# Patient Record
Sex: Male | Born: 1935 | Race: White | Hispanic: No | Marital: Married
Health system: Southern US, Community
[De-identification: ages and names within clinical notes are randomized; demographics above are authoritative.]

---

## 2014-04-27 ENCOUNTER — Other Ambulatory Visit (HOSPITAL_COMMUNITY): Payer: Medicare Other

## 2014-04-27 ENCOUNTER — Ambulatory Visit (HOSPITAL_COMMUNITY)
Admission: AD | Admit: 2014-04-27 | Discharge: 2014-04-27 | Disposition: A | Discharge status: Home / Self Care | Payer: Medicare Other | Source: Other Acute Inpatient Hospital | Attending: Internal Medicine | Admitting: Internal Medicine

## 2014-04-27 ENCOUNTER — Inpatient Hospital Stay
Admission: EM | Admit: 2014-04-27 | Discharge: 2014-06-14 | Disposition: E | Payer: Medicare Other | Source: Other Acute Inpatient Hospital | Attending: Internal Medicine | Admitting: Internal Medicine

## 2014-04-27 DIAGNOSIS — T17908A Unspecified foreign body in respiratory tract, part unspecified causing other injury, initial encounter: Secondary | ICD-10-CM

## 2014-04-27 DIAGNOSIS — Z452 Encounter for adjustment and management of vascular access device: Secondary | ICD-10-CM

## 2014-04-27 DIAGNOSIS — Z4659 Encounter for fitting and adjustment of other gastrointestinal appliance and device: Secondary | ICD-10-CM

## 2014-04-27 DIAGNOSIS — J9690 Respiratory failure, unspecified, unspecified whether with hypoxia or hypercapnia: Secondary | ICD-10-CM | POA: Diagnosis present

## 2014-04-27 DIAGNOSIS — I82C19 Acute embolism and thrombosis of unspecified internal jugular vein: Secondary | ICD-10-CM

## 2014-04-27 DIAGNOSIS — J189 Pneumonia, unspecified organism: Secondary | ICD-10-CM

## 2014-04-27 LAB — CBC WITH DIFFERENTIAL/PLATELET
BASOS ABS: 0 10*3/uL (ref 0.0–0.1)
BASOS PCT: 0 % (ref 0–1)
Eosinophils Absolute: 0.1 10*3/uL (ref 0.0–0.7)
Eosinophils Relative: 1 % (ref 0–5)
HCT: 18.9 % — ABNORMAL LOW (ref 39.0–52.0)
Hemoglobin: 6.2 g/dL — CL (ref 13.0–17.0)
LYMPHS PCT: 15 % (ref 12–46)
Lymphs Abs: 1.7 10*3/uL (ref 0.7–4.0)
MCH: 29.8 pg (ref 26.0–34.0)
MCHC: 32.8 g/dL (ref 30.0–36.0)
MCV: 90.9 fL (ref 78.0–100.0)
MONOS PCT: 13 % — AB (ref 3–12)
Monocytes Absolute: 1.4 10*3/uL — ABNORMAL HIGH (ref 0.1–1.0)
NEUTROS ABS: 7.8 10*3/uL — AB (ref 1.7–7.7)
NEUTROS PCT: 71 % (ref 43–77)
Platelets: 320 10*3/uL (ref 150–400)
RBC: 2.08 MIL/uL — ABNORMAL LOW (ref 4.22–5.81)
RDW: 15.7 % — ABNORMAL HIGH (ref 11.5–15.5)
WBC: 11 10*3/uL — ABNORMAL HIGH (ref 4.0–10.5)

## 2014-04-27 LAB — BLOOD GAS, ARTERIAL
Acid-base deficit: 13 mmol/L — ABNORMAL HIGH (ref 0.0–2.0)
Bicarbonate: 11.7 mEq/L — ABNORMAL LOW (ref 20.0–24.0)
DELIVERY SYSTEMS: POSITIVE
EXPIRATORY PAP: 6
FIO2: 0.4 %
Inspiratory PAP: 12
O2 Saturation: 99 %
PATIENT TEMPERATURE: 97.1
PO2 ART: 153 mmHg — AB (ref 80.0–100.0)
RATE: 12 resp/min
TCO2: 12.3 mmol/L (ref 0–100)
pCO2 arterial: 21.1 mmHg — ABNORMAL LOW (ref 35.0–45.0)
pH, Arterial: 7.357 (ref 7.350–7.450)

## 2014-04-27 LAB — COMPREHENSIVE METABOLIC PANEL
ALK PHOS: 44 U/L (ref 39–117)
ALT: 16 U/L (ref 0–53)
AST: 23 U/L (ref 0–37)
Albumin: 1.7 g/dL — ABNORMAL LOW (ref 3.5–5.2)
Anion gap: 21 — ABNORMAL HIGH (ref 5–15)
BILIRUBIN TOTAL: 0.7 mg/dL (ref 0.3–1.2)
BUN: 66 mg/dL — ABNORMAL HIGH (ref 6–23)
CHLORIDE: 105 meq/L (ref 96–112)
CO2: 12 mEq/L — ABNORMAL LOW (ref 19–32)
Calcium: 7.8 mg/dL — ABNORMAL LOW (ref 8.4–10.5)
Creatinine, Ser: 2.24 mg/dL — ABNORMAL HIGH (ref 0.50–1.35)
GFR calc Af Amer: 28 mL/min — ABNORMAL LOW (ref >90)
GFR calc non Af Amer: 24 mL/min — ABNORMAL LOW (ref >90)
Glucose, Bld: 104 mg/dL — ABNORMAL HIGH (ref 70–99)
POTASSIUM: 4.2 meq/L (ref 3.7–5.3)
Sodium: 138 mEq/L (ref 137–147)
Total Protein: 4.2 g/dL — ABNORMAL LOW (ref 6.0–8.3)

## 2014-04-27 LAB — APTT: aPTT: 22 seconds — ABNORMAL LOW (ref 24–37)

## 2014-04-27 LAB — PHOSPHORUS: Phosphorus: 6.6 mg/dL — ABNORMAL HIGH (ref 2.3–4.6)

## 2014-04-27 LAB — TROPONIN I: TROPONIN I: 0.54 ng/mL — AB (ref <0.30)

## 2014-04-27 LAB — PROTIME-INR
INR: 1.63 — ABNORMAL HIGH (ref 0.00–1.49)
Prothrombin Time: 19.5 seconds — ABNORMAL HIGH (ref 11.6–15.2)

## 2014-04-27 LAB — CK TOTAL AND CKMB (NOT AT ARMC)
CK TOTAL: 112 U/L (ref 7–232)
CK, MB: 5.6 ng/mL — ABNORMAL HIGH (ref 0.3–4.0)
Relative Index: 5 — ABNORMAL HIGH (ref 0.0–2.5)

## 2014-04-27 LAB — ABO/RH: ABO/RH(D): B NEG

## 2014-04-27 LAB — MAGNESIUM: Magnesium: 2.5 mg/dL (ref 1.5–2.5)

## 2014-04-27 LAB — PREPARE RBC (CROSSMATCH)

## 2014-04-27 LAB — VANCOMYCIN, RANDOM: VANCOMYCIN RM: 27.9 ug/mL

## 2014-04-27 LAB — TSH: TSH: 5.41 u[IU]/mL — ABNORMAL HIGH (ref 0.350–4.500)

## 2014-04-28 LAB — BASIC METABOLIC PANEL
Anion gap: 16 — ABNORMAL HIGH (ref 5–15)
BUN: 80 mg/dL — AB (ref 6–23)
CO2: 17 mEq/L — ABNORMAL LOW (ref 19–32)
Calcium: 8 mg/dL — ABNORMAL LOW (ref 8.4–10.5)
Chloride: 107 mEq/L (ref 96–112)
Creatinine, Ser: 2.6 mg/dL — ABNORMAL HIGH (ref 0.50–1.35)
GFR calc Af Amer: 24 mL/min — ABNORMAL LOW (ref >90)
GFR, EST NON AFRICAN AMERICAN: 20 mL/min — AB (ref >90)
Glucose, Bld: 105 mg/dL — ABNORMAL HIGH (ref 70–99)
Potassium: 4.2 mEq/L (ref 3.7–5.3)
SODIUM: 140 meq/L (ref 137–147)

## 2014-04-28 LAB — T4, FREE: FREE T4: 0.94 ng/dL (ref 0.80–1.80)

## 2014-04-28 LAB — CBC
HCT: 26.3 % — ABNORMAL LOW (ref 39.0–52.0)
Hemoglobin: 9.1 g/dL — ABNORMAL LOW (ref 13.0–17.0)
MCH: 30.1 pg (ref 26.0–34.0)
MCHC: 34.6 g/dL (ref 30.0–36.0)
MCV: 87.1 fL (ref 78.0–100.0)
PLATELETS: 227 10*3/uL (ref 150–400)
RBC: 3.02 MIL/uL — ABNORMAL LOW (ref 4.22–5.81)
RDW: 15.5 % (ref 11.5–15.5)
WBC: 14.1 10*3/uL — ABNORMAL HIGH (ref 4.0–10.5)

## 2014-04-28 LAB — VANCOMYCIN, RANDOM: Vancomycin Rm: 20.1 ug/mL

## 2014-04-28 LAB — HEMOGLOBIN AND HEMATOCRIT, BLOOD
HCT: 25.8 % — ABNORMAL LOW (ref 39.0–52.0)
HCT: 26 % — ABNORMAL LOW (ref 39.0–52.0)
HEMATOCRIT: 24.6 % — AB (ref 39.0–52.0)
HEMOGLOBIN: 8.8 g/dL — AB (ref 13.0–17.0)
Hemoglobin: 9 g/dL — ABNORMAL LOW (ref 13.0–17.0)
Hemoglobin: 8.5 g/dL — ABNORMAL LOW (ref 13.0–17.0)

## 2014-04-28 LAB — VITAMIN B12: VITAMIN B 12: 352 pg/mL (ref 211–911)

## 2014-04-29 ENCOUNTER — Institutional Professional Consult (permissible substitution) (HOSPITAL_COMMUNITY): Payer: Medicare Other

## 2014-04-29 LAB — BASIC METABOLIC PANEL
Anion gap: 20 — ABNORMAL HIGH (ref 5–15)
BUN: 119 mg/dL — ABNORMAL HIGH (ref 6–23)
CALCIUM: 8.1 mg/dL — AB (ref 8.4–10.5)
CO2: 14 meq/L — AB (ref 19–32)
Chloride: 116 mEq/L — ABNORMAL HIGH (ref 96–112)
Creatinine, Ser: 3.12 mg/dL — ABNORMAL HIGH (ref 0.50–1.35)
GFR calc Af Amer: 19 mL/min — ABNORMAL LOW (ref >90)
GFR calc non Af Amer: 16 mL/min — ABNORMAL LOW (ref >90)
Glucose, Bld: 93 mg/dL (ref 70–99)
POTASSIUM: 4 meq/L (ref 3.7–5.3)
SODIUM: 150 meq/L — AB (ref 137–147)

## 2014-04-29 LAB — CBC
HCT: 24.3 % — ABNORMAL LOW (ref 39.0–52.0)
Hemoglobin: 8.2 g/dL — ABNORMAL LOW (ref 13.0–17.0)
MCH: 29.4 pg (ref 26.0–34.0)
MCHC: 33.7 g/dL (ref 30.0–36.0)
MCV: 87.1 fL (ref 78.0–100.0)
Platelets: 235 10*3/uL (ref 150–400)
RBC: 2.79 MIL/uL — ABNORMAL LOW (ref 4.22–5.81)
RDW: 17 % — ABNORMAL HIGH (ref 11.5–15.5)
WBC: 11.4 10*3/uL — ABNORMAL HIGH (ref 4.0–10.5)

## 2014-04-29 LAB — FOLATE RBC: RBC FOLATE: 1819 ng/mL — AB (ref >280)

## 2014-04-30 ENCOUNTER — Other Ambulatory Visit (HOSPITAL_COMMUNITY): Payer: Medicare Other

## 2014-04-30 LAB — COMPREHENSIVE METABOLIC PANEL
ALT: 48 U/L (ref 0–53)
ANION GAP: 20 — AB (ref 5–15)
AST: 52 U/L — ABNORMAL HIGH (ref 0–37)
Albumin: 1.8 g/dL — ABNORMAL LOW (ref 3.5–5.2)
Alkaline Phosphatase: 42 U/L (ref 39–117)
BILIRUBIN TOTAL: 0.4 mg/dL (ref 0.3–1.2)
BUN: 123 mg/dL — AB (ref 6–23)
CHLORIDE: 115 meq/L — AB (ref 96–112)
CO2: 14 meq/L — AB (ref 19–32)
CREATININE: 3.48 mg/dL — AB (ref 0.50–1.35)
Calcium: 8 mg/dL — ABNORMAL LOW (ref 8.4–10.5)
GFR calc Af Amer: 17 mL/min — ABNORMAL LOW (ref >90)
GFR, EST NON AFRICAN AMERICAN: 14 mL/min — AB (ref >90)
Glucose, Bld: 194 mg/dL — ABNORMAL HIGH (ref 70–99)
Potassium: 4.4 mEq/L (ref 3.7–5.3)
Sodium: 149 mEq/L — ABNORMAL HIGH (ref 137–147)
Total Protein: 4.1 g/dL — ABNORMAL LOW (ref 6.0–8.3)

## 2014-04-30 LAB — CBC
HEMATOCRIT: 21.4 % — AB (ref 39.0–52.0)
Hemoglobin: 6.9 g/dL — CL (ref 13.0–17.0)
MCH: 29.5 pg (ref 26.0–34.0)
MCHC: 32.2 g/dL (ref 30.0–36.0)
MCV: 91.5 fL (ref 78.0–100.0)
Platelets: 244 10*3/uL (ref 150–400)
RBC: 2.34 MIL/uL — AB (ref 4.22–5.81)
RDW: 18 % — ABNORMAL HIGH (ref 11.5–15.5)
WBC: 13.9 10*3/uL — AB (ref 4.0–10.5)

## 2014-04-30 LAB — MAGNESIUM: MAGNESIUM: 2.7 mg/dL — AB (ref 1.5–2.5)

## 2014-04-30 LAB — PHOSPHORUS: Phosphorus: 6.1 mg/dL — ABNORMAL HIGH (ref 2.3–4.6)

## 2014-05-01 LAB — CBC
HEMATOCRIT: 28.1 % — AB (ref 39.0–52.0)
Hemoglobin: 9 g/dL — ABNORMAL LOW (ref 13.0–17.0)
MCH: 28.6 pg (ref 26.0–34.0)
MCHC: 32 g/dL (ref 30.0–36.0)
MCV: 89.2 fL (ref 78.0–100.0)
Platelets: 221 10*3/uL (ref 150–400)
RBC: 3.15 MIL/uL — ABNORMAL LOW (ref 4.22–5.81)
RDW: 17.6 % — AB (ref 11.5–15.5)
WBC: 16.4 10*3/uL — ABNORMAL HIGH (ref 4.0–10.5)

## 2014-05-01 LAB — TYPE AND SCREEN
ABO/RH(D): B NEG
ANTIBODY SCREEN: NEGATIVE
UNIT DIVISION: 0
UNIT DIVISION: 0
UNIT DIVISION: 0
Unit division: 0
Unit division: 0
Unit division: 0

## 2014-05-01 LAB — RENAL FUNCTION PANEL
ALBUMIN: 1.9 g/dL — AB (ref 3.5–5.2)
ANION GAP: 17 — AB (ref 5–15)
BUN: 121 mg/dL — AB (ref 6–23)
CHLORIDE: 115 meq/L — AB (ref 96–112)
CO2: 16 mEq/L — ABNORMAL LOW (ref 19–32)
CREATININE: 3.91 mg/dL — AB (ref 0.50–1.35)
Calcium: 8.1 mg/dL — ABNORMAL LOW (ref 8.4–10.5)
GFR calc Af Amer: 14 mL/min — ABNORMAL LOW (ref >90)
GFR, EST NON AFRICAN AMERICAN: 12 mL/min — AB (ref >90)
Glucose, Bld: 169 mg/dL — ABNORMAL HIGH (ref 70–99)
PHOSPHORUS: 6.3 mg/dL — AB (ref 2.3–4.6)
POTASSIUM: 4.7 meq/L (ref 3.7–5.3)
Sodium: 148 mEq/L — ABNORMAL HIGH (ref 137–147)

## 2014-05-01 LAB — MAGNESIUM: Magnesium: 2.9 mg/dL — ABNORMAL HIGH (ref 1.5–2.5)

## 2014-05-02 LAB — CBC
HEMATOCRIT: 27.4 % — AB (ref 39.0–52.0)
Hemoglobin: 8.8 g/dL — ABNORMAL LOW (ref 13.0–17.0)
MCH: 28.7 pg (ref 26.0–34.0)
MCHC: 32.1 g/dL (ref 30.0–36.0)
MCV: 89.3 fL (ref 78.0–100.0)
Platelets: 202 10*3/uL (ref 150–400)
RBC: 3.07 MIL/uL — ABNORMAL LOW (ref 4.22–5.81)
RDW: 17.7 % — ABNORMAL HIGH (ref 11.5–15.5)
WBC: 14.4 10*3/uL — ABNORMAL HIGH (ref 4.0–10.5)

## 2014-05-02 LAB — COMPREHENSIVE METABOLIC PANEL
ALT: 30 U/L (ref 0–53)
AST: 30 U/L (ref 0–37)
Albumin: 2.1 g/dL — ABNORMAL LOW (ref 3.5–5.2)
Alkaline Phosphatase: 44 U/L (ref 39–117)
Anion gap: 20 — ABNORMAL HIGH (ref 5–15)
BILIRUBIN TOTAL: 0.5 mg/dL (ref 0.3–1.2)
BUN: 118 mg/dL — AB (ref 6–23)
CO2: 18 mEq/L — ABNORMAL LOW (ref 19–32)
CREATININE: 3.61 mg/dL — AB (ref 0.50–1.35)
Calcium: 8.2 mg/dL — ABNORMAL LOW (ref 8.4–10.5)
Chloride: 115 mEq/L — ABNORMAL HIGH (ref 96–112)
GFR calc Af Amer: 16 mL/min — ABNORMAL LOW (ref >90)
GFR calc non Af Amer: 14 mL/min — ABNORMAL LOW (ref >90)
GLUCOSE: 104 mg/dL — AB (ref 70–99)
Potassium: 3.7 mEq/L (ref 3.7–5.3)
Sodium: 153 mEq/L — ABNORMAL HIGH (ref 137–147)
Total Protein: 4.5 g/dL — ABNORMAL LOW (ref 6.0–8.3)

## 2014-05-02 LAB — TRIGLYCERIDES: Triglycerides: 151 mg/dL — ABNORMAL HIGH (ref <150)

## 2014-05-02 LAB — MAGNESIUM: Magnesium: 2.5 mg/dL (ref 1.5–2.5)

## 2014-05-02 LAB — PROCALCITONIN: Procalcitonin: 0.66 ng/mL

## 2014-05-02 LAB — PHOSPHORUS: Phosphorus: 3.6 mg/dL (ref 2.3–4.6)

## 2014-05-03 LAB — HEMOGLOBIN AND HEMATOCRIT, BLOOD
HEMATOCRIT: 27.9 % — AB (ref 39.0–52.0)
HEMOGLOBIN: 8.8 g/dL — AB (ref 13.0–17.0)

## 2014-05-03 LAB — BASIC METABOLIC PANEL
Anion gap: 17 — ABNORMAL HIGH (ref 5–15)
BUN: 111 mg/dL — ABNORMAL HIGH (ref 6–23)
CHLORIDE: 113 meq/L — AB (ref 96–112)
CO2: 22 mEq/L (ref 19–32)
Calcium: 8.4 mg/dL (ref 8.4–10.5)
Creatinine, Ser: 3.43 mg/dL — ABNORMAL HIGH (ref 0.50–1.35)
GFR calc non Af Amer: 15 mL/min — ABNORMAL LOW (ref >90)
GFR, EST AFRICAN AMERICAN: 17 mL/min — AB (ref >90)
GLUCOSE: 117 mg/dL — AB (ref 70–99)
POTASSIUM: 3.6 meq/L — AB (ref 3.7–5.3)
Sodium: 152 mEq/L — ABNORMAL HIGH (ref 137–147)

## 2014-05-03 LAB — MAGNESIUM: Magnesium: 2.6 mg/dL — ABNORMAL HIGH (ref 1.5–2.5)

## 2014-05-04 LAB — CBC
HCT: 25.3 % — ABNORMAL LOW (ref 39.0–52.0)
HEMOGLOBIN: 8.4 g/dL — AB (ref 13.0–17.0)
MCH: 30.3 pg (ref 26.0–34.0)
MCHC: 33.2 g/dL (ref 30.0–36.0)
MCV: 91.3 fL (ref 78.0–100.0)
Platelets: 185 10*3/uL (ref 150–400)
RBC: 2.77 MIL/uL — ABNORMAL LOW (ref 4.22–5.81)
RDW: 18.1 % — ABNORMAL HIGH (ref 11.5–15.5)
WBC: 10.6 10*3/uL — ABNORMAL HIGH (ref 4.0–10.5)

## 2014-05-04 LAB — BASIC METABOLIC PANEL
ANION GAP: 23 — AB (ref 5–15)
BUN: 108 mg/dL — ABNORMAL HIGH (ref 6–23)
CHLORIDE: 107 meq/L (ref 96–112)
CO2: 17 mEq/L — ABNORMAL LOW (ref 19–32)
CREATININE: 3.39 mg/dL — AB (ref 0.50–1.35)
Calcium: 8.2 mg/dL — ABNORMAL LOW (ref 8.4–10.5)
GFR, EST AFRICAN AMERICAN: 17 mL/min — AB (ref >90)
GFR, EST NON AFRICAN AMERICAN: 15 mL/min — AB (ref >90)
Glucose, Bld: 106 mg/dL — ABNORMAL HIGH (ref 70–99)
POTASSIUM: 3.7 meq/L (ref 3.7–5.3)
Sodium: 147 mEq/L (ref 137–147)

## 2014-05-04 LAB — MAGNESIUM: MAGNESIUM: 2.3 mg/dL (ref 1.5–2.5)

## 2014-05-04 LAB — VANCOMYCIN, TROUGH: Vancomycin Tr: 9.2 ug/mL — ABNORMAL LOW (ref 10.0–20.0)

## 2014-05-05 LAB — BASIC METABOLIC PANEL
Anion gap: 22 — ABNORMAL HIGH (ref 5–15)
BUN: 103 mg/dL — ABNORMAL HIGH (ref 6–23)
CHLORIDE: 106 meq/L (ref 96–112)
CO2: 16 mEq/L — ABNORMAL LOW (ref 19–32)
CREATININE: 3.42 mg/dL — AB (ref 0.50–1.35)
Calcium: 8.2 mg/dL — ABNORMAL LOW (ref 8.4–10.5)
GFR calc Af Amer: 17 mL/min — ABNORMAL LOW (ref >90)
GFR calc non Af Amer: 15 mL/min — ABNORMAL LOW (ref >90)
Glucose, Bld: 96 mg/dL (ref 70–99)
Potassium: 3.8 mEq/L (ref 3.7–5.3)
Sodium: 144 mEq/L (ref 137–147)

## 2014-05-05 LAB — PREPARE RBC (CROSSMATCH)

## 2014-05-05 LAB — MAGNESIUM: MAGNESIUM: 2.3 mg/dL (ref 1.5–2.5)

## 2014-05-05 LAB — HEMOGLOBIN AND HEMATOCRIT, BLOOD
HCT: 22.1 % — ABNORMAL LOW (ref 39.0–52.0)
Hemoglobin: 7.2 g/dL — ABNORMAL LOW (ref 13.0–17.0)

## 2014-05-06 LAB — CBC
HCT: 31.4 % — ABNORMAL LOW (ref 39.0–52.0)
HEMOGLOBIN: 10.2 g/dL — AB (ref 13.0–17.0)
MCH: 28.4 pg (ref 26.0–34.0)
MCHC: 32.5 g/dL (ref 30.0–36.0)
MCV: 87.5 fL (ref 78.0–100.0)
Platelets: 136 10*3/uL — ABNORMAL LOW (ref 150–400)
RBC: 3.59 MIL/uL — ABNORMAL LOW (ref 4.22–5.81)
RDW: 19.4 % — ABNORMAL HIGH (ref 11.5–15.5)
WBC: 8.7 10*3/uL (ref 4.0–10.5)

## 2014-05-06 LAB — COMPREHENSIVE METABOLIC PANEL
ALBUMIN: 2.1 g/dL — AB (ref 3.5–5.2)
ALK PHOS: 58 U/L (ref 39–117)
ALT: 24 U/L (ref 0–53)
AST: 39 U/L — AB (ref 0–37)
Anion gap: 24 — ABNORMAL HIGH (ref 5–15)
BUN: 101 mg/dL — ABNORMAL HIGH (ref 6–23)
CHLORIDE: 104 meq/L (ref 96–112)
CO2: 15 mEq/L — ABNORMAL LOW (ref 19–32)
Calcium: 8.3 mg/dL — ABNORMAL LOW (ref 8.4–10.5)
Creatinine, Ser: 3.51 mg/dL — ABNORMAL HIGH (ref 0.50–1.35)
GFR calc Af Amer: 16 mL/min — ABNORMAL LOW (ref >90)
GFR calc non Af Amer: 14 mL/min — ABNORMAL LOW (ref >90)
Glucose, Bld: 89 mg/dL (ref 70–99)
POTASSIUM: 3.8 meq/L (ref 3.7–5.3)
SODIUM: 143 meq/L (ref 137–147)
TOTAL PROTEIN: 4.6 g/dL — AB (ref 6.0–8.3)
Total Bilirubin: 1.1 mg/dL (ref 0.3–1.2)

## 2014-05-06 LAB — TYPE AND SCREEN
ABO/RH(D): B NEG
Antibody Screen: NEGATIVE
Unit division: 0
Unit division: 0

## 2014-05-06 LAB — TRIGLYCERIDES: TRIGLYCERIDES: 173 mg/dL — AB (ref <150)

## 2014-05-06 LAB — PHOSPHORUS: PHOSPHORUS: 4.7 mg/dL — AB (ref 2.3–4.6)

## 2014-05-06 LAB — MAGNESIUM: MAGNESIUM: 2.3 mg/dL (ref 1.5–2.5)

## 2014-05-07 LAB — BASIC METABOLIC PANEL
Anion gap: 24 — ABNORMAL HIGH (ref 5–15)
BUN: 103 mg/dL — AB (ref 6–23)
CALCIUM: 8.6 mg/dL (ref 8.4–10.5)
CO2: 16 meq/L — AB (ref 19–32)
Chloride: 100 mEq/L (ref 96–112)
Creatinine, Ser: 3.53 mg/dL — ABNORMAL HIGH (ref 0.50–1.35)
GFR calc Af Amer: 16 mL/min — ABNORMAL LOW (ref >90)
GFR, EST NON AFRICAN AMERICAN: 14 mL/min — AB (ref >90)
Glucose, Bld: 88 mg/dL (ref 70–99)
Potassium: 3.8 mEq/L (ref 3.7–5.3)
Sodium: 140 mEq/L (ref 137–147)

## 2014-05-07 LAB — CBC
HEMATOCRIT: 33.3 % — AB (ref 39.0–52.0)
HEMOGLOBIN: 10.8 g/dL — AB (ref 13.0–17.0)
MCH: 28.5 pg (ref 26.0–34.0)
MCHC: 32.4 g/dL (ref 30.0–36.0)
MCV: 87.9 fL (ref 78.0–100.0)
Platelets: 139 10*3/uL — ABNORMAL LOW (ref 150–400)
RBC: 3.79 MIL/uL — AB (ref 4.22–5.81)
RDW: 19.6 % — ABNORMAL HIGH (ref 11.5–15.5)
WBC: 9.5 10*3/uL (ref 4.0–10.5)

## 2014-05-07 LAB — MAGNESIUM: Magnesium: 2.4 mg/dL (ref 1.5–2.5)

## 2014-05-08 ENCOUNTER — Other Ambulatory Visit (HOSPITAL_COMMUNITY): Payer: Medicare Other

## 2014-05-08 DIAGNOSIS — I82C19 Acute embolism and thrombosis of unspecified internal jugular vein: Secondary | ICD-10-CM

## 2014-05-08 LAB — MAGNESIUM: Magnesium: 2.5 mg/dL (ref 1.5–2.5)

## 2014-05-08 NOTE — Progress Notes (Addendum)
*  PRELIMINARY RESULTS* Vascular Ultrasound Right upper extremity venous duplex has been completed.  Preliminary findings: Limited evaluation of right IJV due to line/ bandages, however no obvious evidence of DVT noted in right upper extremity.   Farrel DemarkJill Eunice, RDMS, RVT  05/08/2014, 4:50 PM

## 2014-05-09 LAB — COMPREHENSIVE METABOLIC PANEL
ALBUMIN: 2 g/dL — AB (ref 3.5–5.2)
ALK PHOS: 66 U/L (ref 39–117)
ALT: 19 U/L (ref 0–53)
AST: 28 U/L (ref 0–37)
Anion gap: 24 — ABNORMAL HIGH (ref 5–15)
BUN: 100 mg/dL — ABNORMAL HIGH (ref 6–23)
CO2: 13 mEq/L — ABNORMAL LOW (ref 19–32)
Calcium: 9.2 mg/dL (ref 8.4–10.5)
Chloride: 101 mEq/L (ref 96–112)
Creatinine, Ser: 3.84 mg/dL — ABNORMAL HIGH (ref 0.50–1.35)
GFR calc non Af Amer: 13 mL/min — ABNORMAL LOW (ref >90)
GFR, EST AFRICAN AMERICAN: 15 mL/min — AB (ref >90)
GLUCOSE: 112 mg/dL — AB (ref 70–99)
POTASSIUM: 4.2 meq/L (ref 3.7–5.3)
Sodium: 138 mEq/L (ref 137–147)
Total Bilirubin: 0.5 mg/dL (ref 0.3–1.2)
Total Protein: 5 g/dL — ABNORMAL LOW (ref 6.0–8.3)

## 2014-05-09 LAB — PHOSPHORUS: Phosphorus: 4.4 mg/dL (ref 2.3–4.6)

## 2014-05-09 LAB — TRIGLYCERIDES: TRIGLYCERIDES: 254 mg/dL — AB (ref <150)

## 2014-05-09 LAB — MAGNESIUM: Magnesium: 2.4 mg/dL (ref 1.5–2.5)

## 2014-05-10 LAB — CBC
HEMATOCRIT: 34.1 % — AB (ref 39.0–52.0)
HEMOGLOBIN: 10.9 g/dL — AB (ref 13.0–17.0)
MCH: 27.3 pg (ref 26.0–34.0)
MCHC: 32 g/dL (ref 30.0–36.0)
MCV: 85.5 fL (ref 78.0–100.0)
Platelets: 165 10*3/uL (ref 150–400)
RBC: 3.99 MIL/uL — AB (ref 4.22–5.81)
RDW: 19.3 % — ABNORMAL HIGH (ref 11.5–15.5)
WBC: 11.1 10*3/uL — AB (ref 4.0–10.5)

## 2014-05-10 LAB — BASIC METABOLIC PANEL
ANION GAP: 22 — AB (ref 5–15)
BUN: 99 mg/dL — AB (ref 6–23)
CALCIUM: 9 mg/dL (ref 8.4–10.5)
CHLORIDE: 100 meq/L (ref 96–112)
CO2: 16 meq/L — AB (ref 19–32)
Creatinine, Ser: 3.8 mg/dL — ABNORMAL HIGH (ref 0.50–1.35)
GFR calc Af Amer: 15 mL/min — ABNORMAL LOW (ref >90)
GFR calc non Af Amer: 13 mL/min — ABNORMAL LOW (ref >90)
Glucose, Bld: 124 mg/dL — ABNORMAL HIGH (ref 70–99)
Potassium: 4 mEq/L (ref 3.7–5.3)
Sodium: 138 mEq/L (ref 137–147)

## 2014-05-10 LAB — MAGNESIUM: Magnesium: 2.5 mg/dL (ref 1.5–2.5)

## 2014-05-11 LAB — MAGNESIUM: Magnesium: 2.6 mg/dL — ABNORMAL HIGH (ref 1.5–2.5)

## 2014-05-12 LAB — MAGNESIUM: Magnesium: 2.8 mg/dL — ABNORMAL HIGH (ref 1.5–2.5)

## 2014-05-13 ENCOUNTER — Other Ambulatory Visit (HOSPITAL_COMMUNITY): Payer: Medicare Other

## 2014-05-13 LAB — COMPREHENSIVE METABOLIC PANEL
ALK PHOS: 148 U/L — AB (ref 39–117)
ALT: 14 U/L (ref 0–53)
AST: 39 U/L — ABNORMAL HIGH (ref 0–37)
Albumin: 1.8 g/dL — ABNORMAL LOW (ref 3.5–5.2)
Anion gap: 21 — ABNORMAL HIGH (ref 5–15)
BUN: 125 mg/dL — ABNORMAL HIGH (ref 6–23)
CALCIUM: 9.8 mg/dL (ref 8.4–10.5)
CO2: 18 meq/L — AB (ref 19–32)
Chloride: 106 mEq/L (ref 96–112)
Creatinine, Ser: 4 mg/dL — ABNORMAL HIGH (ref 0.50–1.35)
GFR, EST AFRICAN AMERICAN: 14 mL/min — AB (ref >90)
GFR, EST NON AFRICAN AMERICAN: 12 mL/min — AB (ref >90)
GLUCOSE: 100 mg/dL — AB (ref 70–99)
POTASSIUM: 4.5 meq/L (ref 3.7–5.3)
Sodium: 145 mEq/L (ref 137–147)
Total Bilirubin: 0.9 mg/dL (ref 0.3–1.2)
Total Protein: 5 g/dL — ABNORMAL LOW (ref 6.0–8.3)

## 2014-05-13 LAB — TRIGLYCERIDES: Triglycerides: 147 mg/dL (ref <150)

## 2014-05-13 LAB — PHOSPHORUS: Phosphorus: 4 mg/dL (ref 2.3–4.6)

## 2014-05-13 LAB — MAGNESIUM: Magnesium: 3 mg/dL — ABNORMAL HIGH (ref 1.5–2.5)

## 2014-05-13 LAB — PROCALCITONIN: Procalcitonin: 0.3 ng/mL

## 2014-05-13 LAB — CBC WITH DIFFERENTIAL/PLATELET
BASOS ABS: 0.1 10*3/uL (ref 0.0–0.1)
Basophils Relative: 1 % (ref 0–1)
Eosinophils Absolute: 0.3 10*3/uL (ref 0.0–0.7)
Eosinophils Relative: 2 % (ref 0–5)
HEMATOCRIT: 35.4 % — AB (ref 39.0–52.0)
Hemoglobin: 11.3 g/dL — ABNORMAL LOW (ref 13.0–17.0)
LYMPHS ABS: 1.3 10*3/uL (ref 0.7–4.0)
Lymphocytes Relative: 10 % — ABNORMAL LOW (ref 12–46)
MCH: 28.1 pg (ref 26.0–34.0)
MCHC: 31.9 g/dL (ref 30.0–36.0)
MCV: 88.1 fL (ref 78.0–100.0)
MONO ABS: 3.9 10*3/uL — AB (ref 0.1–1.0)
MONOS PCT: 31 % — AB (ref 3–12)
Neutro Abs: 6.9 10*3/uL (ref 1.7–7.7)
Neutrophils Relative %: 56 % (ref 43–77)
Platelets: 168 10*3/uL (ref 150–400)
RBC: 4.02 MIL/uL — ABNORMAL LOW (ref 4.22–5.81)
RDW: 19 % — ABNORMAL HIGH (ref 11.5–15.5)
WBC: 12.5 10*3/uL — AB (ref 4.0–10.5)

## 2014-05-13 LAB — PATHOLOGIST SMEAR REVIEW

## 2014-05-13 LAB — PRO B NATRIURETIC PEPTIDE: Pro B Natriuretic peptide (BNP): 8006 pg/mL — ABNORMAL HIGH (ref 0–450)

## 2014-05-14 LAB — MAGNESIUM: Magnesium: 3 mg/dL — ABNORMAL HIGH (ref 1.5–2.5)

## 2014-05-14 DEATH — deceased

## 2014-05-15 LAB — MAGNESIUM: MAGNESIUM: 3.2 mg/dL — AB (ref 1.5–2.5)

## 2014-06-14 DEATH — deceased

## 2016-07-08 IMAGING — CR DG CHEST 1V PORT
1 series · 1 of 1 positions shown · non-contrast
Comparison: Yesterday

CLINICAL DATA: Respiratory failure

EXAM:
PORTABLE CHEST - 1 VIEW

[AP]
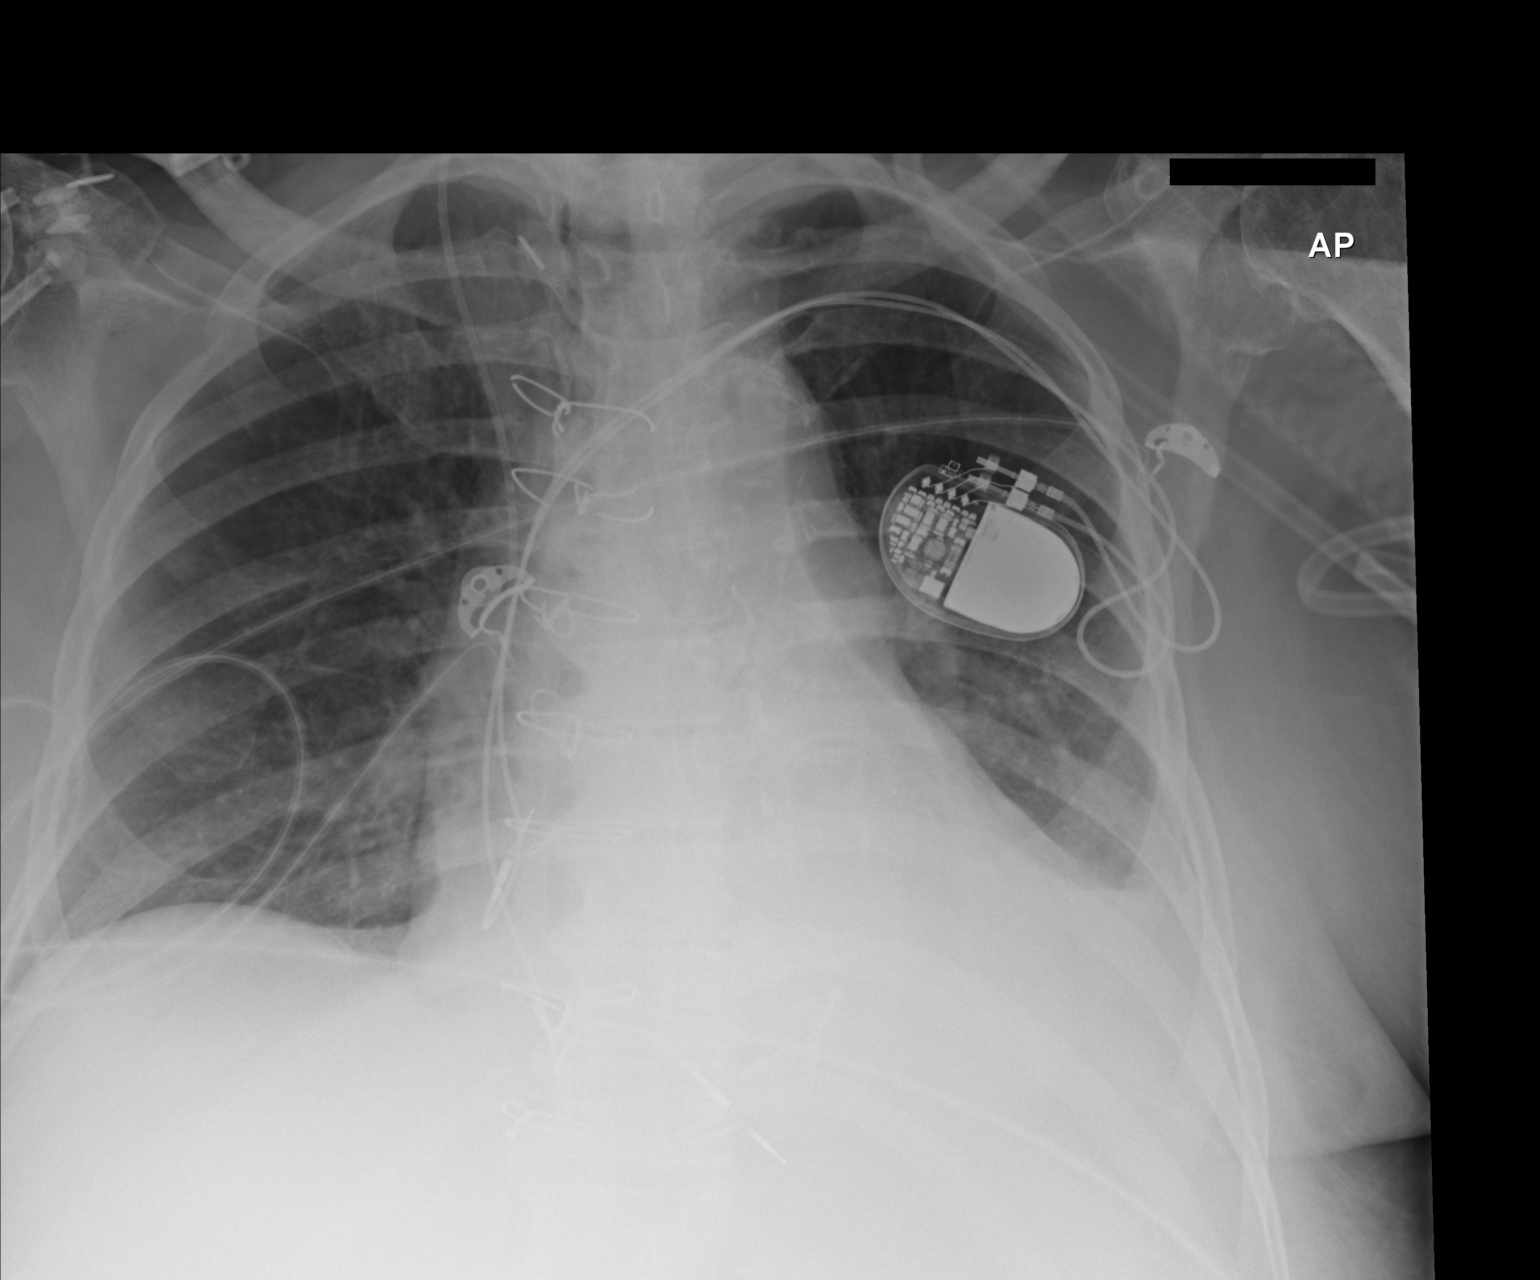

[1 of 1 positions shown; findings below may reference images not displayed]

FINDINGS: Stable right jugular PICC and left subclavian dual lead pacemaker.
Mild cardiomegaly. Left pleural effusion and basilar consolidation
are stable. No pneumothorax.
IMPRESSION: Stable left basilar consolidation and left pleural effusion.

## 2016-07-16 IMAGING — CR DG CHEST 1V PORT
1 series · 1 of 1 positions shown · non-contrast
Comparison: Portable chest x-ray April 30, 2014

CLINICAL DATA: Aspiration

EXAM:
PORTABLE CHEST - 1 VIEW

[AP]
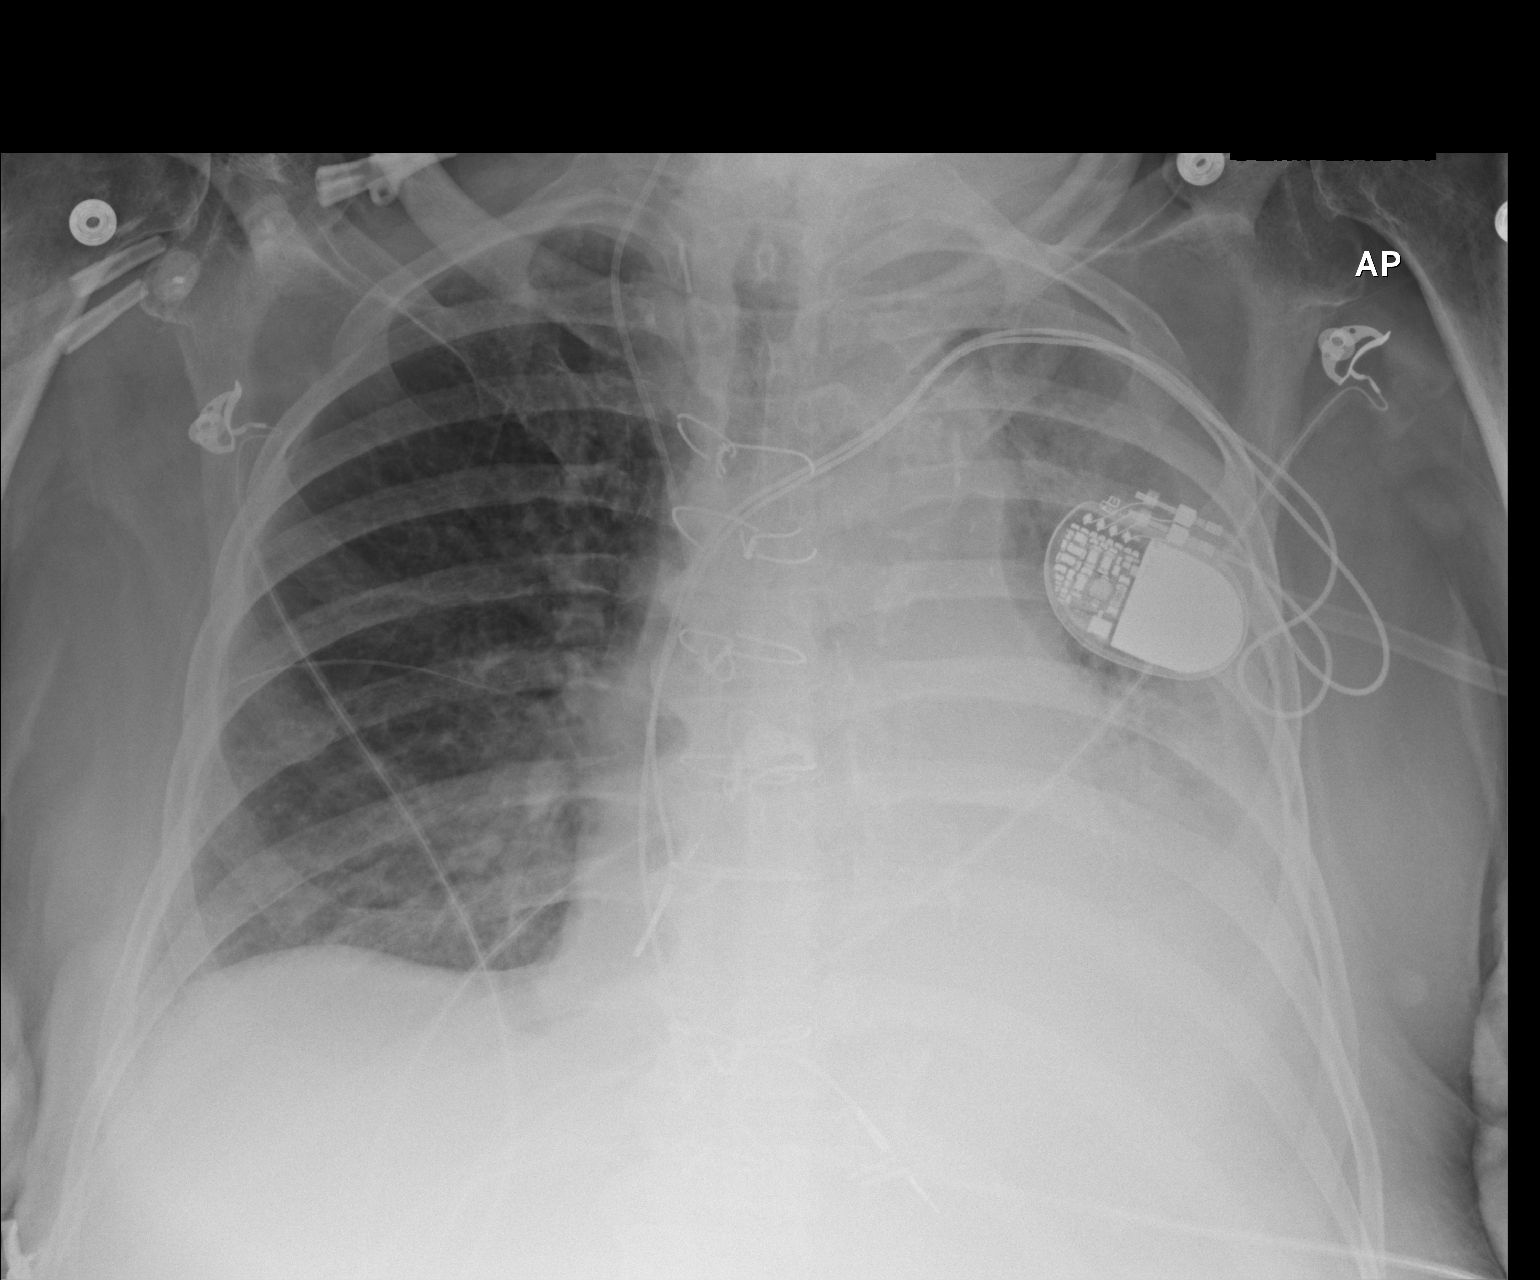

[1 of 1 positions shown; findings below may reference images not displayed]

FINDINGS: Progressive increased density within the pulmonary interstitium has
occurred since yesterday's study. The left hemidiaphragm remains
obscured. There is also more pleural fluid which layers inferiorly
and laterally. The retrocardiac region is quite dense. There is
obscuration of the aortic arch and of much of the left heart border.
The right lung is adequately inflated and clear.

The right internal jugular venous catheter tip projects over the
proximal third of the SVC. The permanent pacemaker is unchanged in
position. The upper 3 of the 6 sternal wires are broken.
IMPRESSION: Worsening atelectasis on the left with increased volume of pleural
fluid likely secondary to previous aspiration and subsequent
development of pneumonia. The right lung is clear.

## 2016-07-21 IMAGING — CR DG CHEST 1V PORT
1 series · 1 of 1 positions shown · non-contrast
Comparison: Single view of the chest 05/08/2014 and 04/30/2014.

CLINICAL DATA: Respiratory failure.

EXAM:
PORTABLE CHEST - 1 VIEW

[AP]
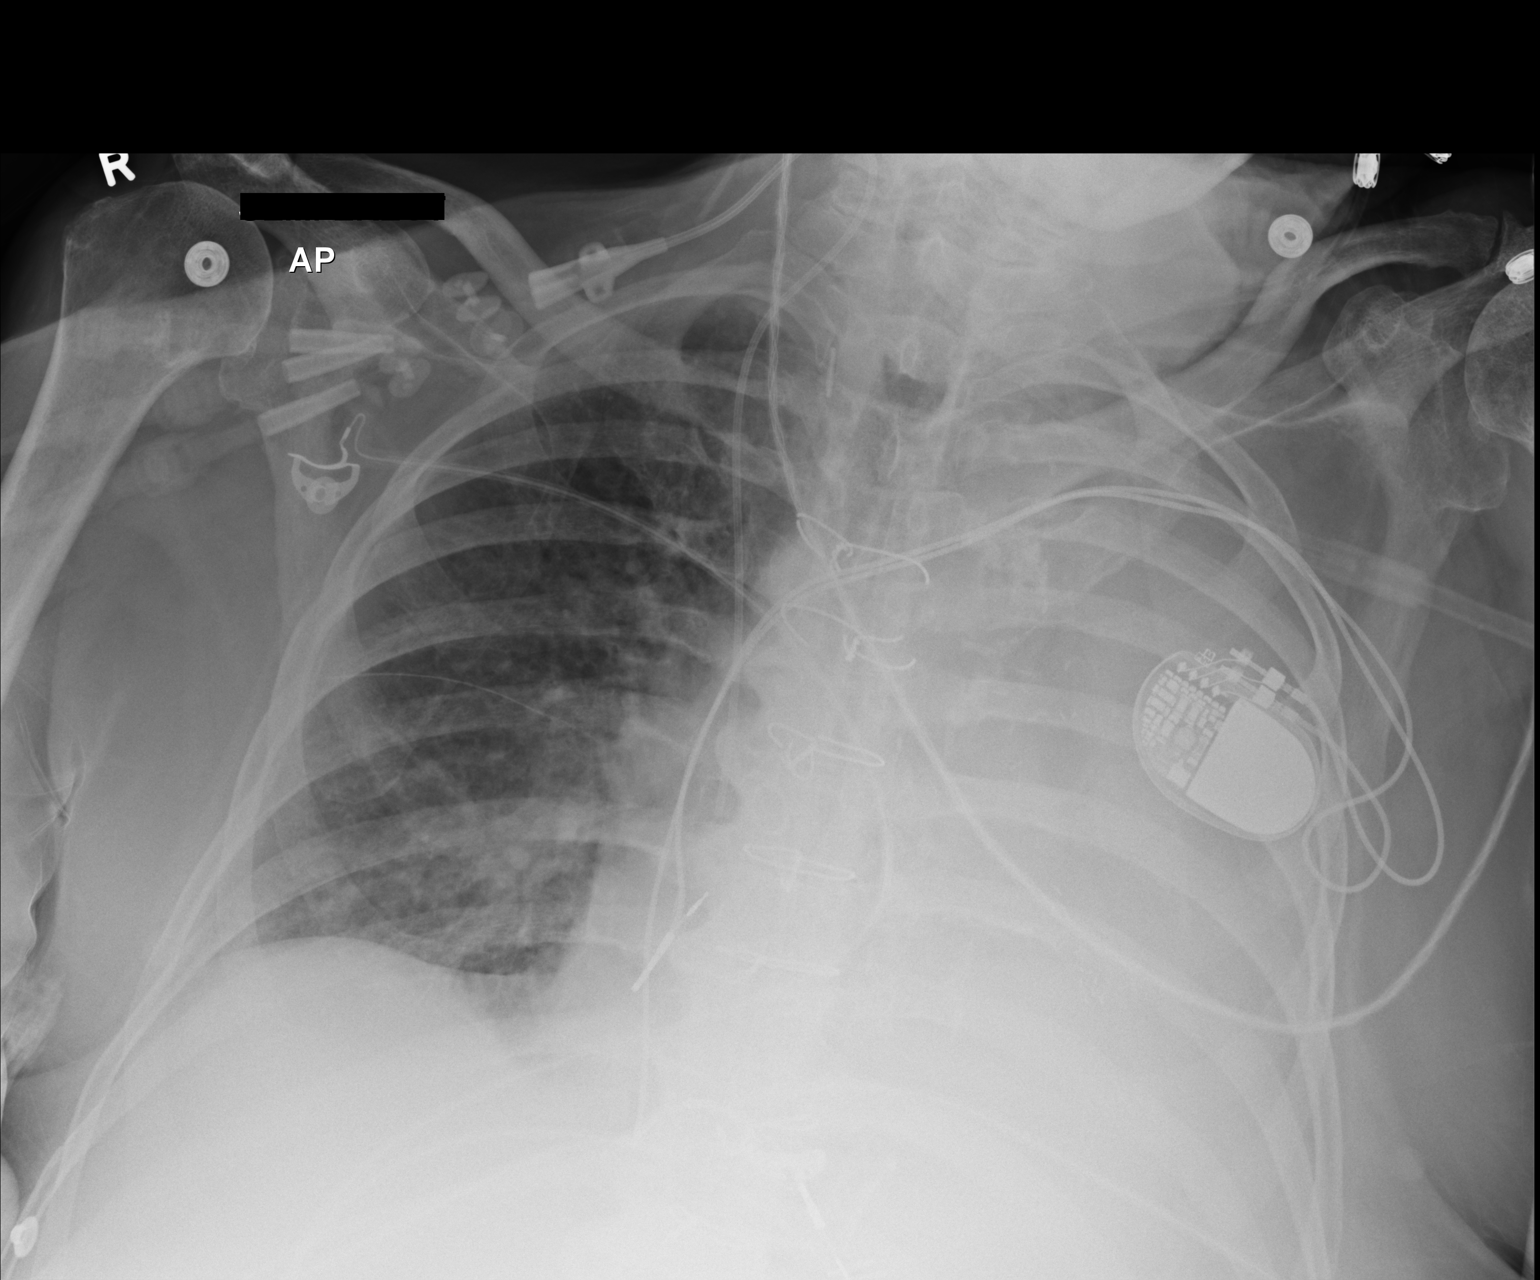

[1 of 1 positions shown; findings below may reference images not displayed]

FINDINGS: Support apparatus is unchanged. There has been marked worsening of
aeration in the left chest which is now completely Rudi out. Mild
atelectasis is seen in the right lung base. No pneumothorax
identified. The cardiac silhouette is obscured.
IMPRESSION: Marked worsening of aeration in the left chest which is completely
Rudi out consistent with effusion and airspace disease.
